# Patient Record
Sex: Male | Born: 1970 | Race: White | Hispanic: No | Marital: Married | Smoking: Former smoker
Health system: Southern US, Community
[De-identification: ages and names within clinical notes are randomized; demographics above are authoritative.]

## PROBLEM LIST (undated history)

## (undated) DIAGNOSIS — F419 Anxiety disorder, unspecified: Secondary | ICD-10-CM

---

## 2021-03-27 ENCOUNTER — Emergency Department (INDEPENDENT_AMBULATORY_CARE_PROVIDER_SITE_OTHER)
Admission: RE | Admit: 2021-03-27 | Discharge: 2021-03-27 | Disposition: A | Discharge status: Home / Self Care | Payer: 59 | Source: Ambulatory Visit | Attending: Emergency Medicine | Admitting: Emergency Medicine

## 2021-03-27 ENCOUNTER — Emergency Department (INDEPENDENT_AMBULATORY_CARE_PROVIDER_SITE_OTHER): Payer: 59

## 2021-03-27 ENCOUNTER — Other Ambulatory Visit: Payer: Self-pay

## 2021-03-27 VITALS — BP 102/73 | HR 82 | Temp 97.9°F | Resp 20 | Ht 66.0 in | Wt 167.0 lb

## 2021-03-27 DIAGNOSIS — R051 Acute cough: Secondary | ICD-10-CM | POA: Diagnosis not present

## 2021-03-27 DIAGNOSIS — J069 Acute upper respiratory infection, unspecified: Secondary | ICD-10-CM

## 2021-03-27 DIAGNOSIS — R059 Cough, unspecified: Secondary | ICD-10-CM

## 2021-03-27 HISTORY — DX: Anxiety disorder, unspecified: F41.9

## 2021-03-27 MED ORDER — ALBUTEROL SULFATE HFA 108 (90 BASE) MCG/ACT IN AERS: 1.0000 | INHALATION_SPRAY | RESPIRATORY_TRACT | 0 refills | Status: DC | PRN

## 2021-03-27 MED ORDER — PREDNISONE 20 MG PO TABS: 40.0000 mg | ORAL_TABLET | Freq: Every day | ORAL | 0 refills | Status: AC

## 2021-03-27 MED ORDER — AEROCHAMBER PLUS MISC: 2 refills | Status: DC

## 2021-03-27 NOTE — ED Provider Notes (Signed)
HPI  SUBJECTIVE:  Xavier Mcclain is a 50 y.o. male who presents with nasal congestion, greenish-yellow rhinorrhea, and lingering cough.  States that he gets easily fatigued with exertion.  He states that he was sick with flulike symptoms 2-1/2-week ago, but did not seek medical evaluation.  He has had a negative home COVID test.  He states that he overall feels good, and can exercise without any problems.  No sinus pain or pressure, postnasal drip, facial swelling, upper dental pain, wheezing, chest pain.  He is able to sleep at night without waking up coughing.  No GERD symptoms.  No antibiotics in the past 3 months.  No antipyretic in the past 6 hours.  He has tried cough drops and Robitussin.  Cough drops help.  No aggravating factors.  He got the first dose of the COVID-vaccine.  He did not get this years flu vaccine.  He has a past medical history of asthma.  PMD: He is in the process of establishing care.  He states that overall he is getting better but is concerned because the cough has not yet resolved.   Past Medical History:  Diagnosis Date   Anxiety     History reviewed. No pertinent surgical history.  Family History  Problem Relation Age of Onset   Dementia Mother    Healthy Father     Social History   Tobacco Use   Smoking status: Former    Years: 20.00    Types: Cigarettes   Smokeless tobacco: Never  Vaping Use   Vaping Use: Never used  Substance Use Topics   Alcohol use: Not Currently   Drug use: Not Currently    No current facility-administered medications for this encounter.  Current Outpatient Medications:    albuterol (VENTOLIN HFA) 108 (90 Base) MCG/ACT inhaler, Inhale 1-2 puffs into the lungs every 4 (four) hours as needed for wheezing or shortness of breath., Disp: 1 each, Rfl: 0   Menthol-Ascorbic Acid (LUDENS COUGH DROPS MT), Use as directed in the mouth or throat., Disp: , Rfl:    predniSONE (DELTASONE) 20 MG tablet, Take 2 tablets (40 mg total) by  mouth daily with breakfast for 5 days., Disp: 10 tablet, Rfl: 0   Spacer/Aero-Holding Chambers (AEROCHAMBER PLUS) inhaler, Use with inhaler, Disp: 1 each, Rfl: 2   escitalopram (LEXAPRO) 5 MG tablet, Take 5 mg by mouth daily., Disp: , Rfl:   Allergies  Allergen Reactions   Bee Venom Anaphylaxis     ROS  As noted in HPI.   Physical Exam  BP 102/73 (BP Location: Right Arm)    Pulse 82    Temp 97.9 F (36.6 C) (Oral)    Resp 20    Ht 5\' 6"  (1.676 m)    Wt 75.8 kg    SpO2 96%    BMI 26.95 kg/m   Constitutional: Well developed, well nourished, no acute distress Eyes:  EOMI, conjunctiva normal bilaterally HENT: Normocephalic, atraumatic,mucus membranes moist.  No nasal congestion.  Normal turbinates.  No maxillary, frontal sinus tenderness Respiratory: Normal inspiratory effort, wheezing bilateral bases.  Good air movement.  No chest wall tenderness Cardiovascular: Normal rate, regular rhythm, no murmurs, rubs, gallops GI: nondistended skin: No rash, skin intact Musculoskeletal: no deformities Neurologic: Alert & oriented x 3, no focal neuro deficits Psychiatric: Speech and behavior appropriate   ED Course   Medications - No data to display  Orders Placed This Encounter  Procedures   DG Chest 2 View    Standing  Status:   Standing    Number of Occurrences:   1    Order Specific Question:   Symptom/Reason for Exam    Answer:   Cough [035.2.ICD-9-CM]    No results found for this or any previous visit (from the past 24 hour(s)). DG Chest 2 View  Result Date: 03/27/2021 CLINICAL DATA:  Lingering cough since 2.5 weeks ago. EXAM: CHEST - 2 VIEW COMPARISON:  None. FINDINGS: The heart size and mediastinal contours are within normal limits. Both lungs are clear. The visualized skeletal structures are unremarkable. IMPRESSION: No active cardiopulmonary disease. Electronically Signed   By: Elige Ko M.D.   On: 03/27/2021 10:13    ED Clinical Impression  1. Viral URI with cough    2. Cough   3. Acute cough      ED Assessment/Plan  Checking chest x-ray due to focal lung findings.  He has no evidence of a sinus infection.  If positive for pneumonia, will send home with antibiotics.  If negative, will treat as bronchitis with albuterol, prednisone.  Follow-up with PMD as needed.  Reviewed imaging independently.  No pneumonia.  Normal chest x-ray.  See radiology report for full details.  X-ray negative for pneumonia.  States that overall he is getting better.  Regularly scheduled albuterol for the next 4 days, then as needed, prednisone 40 mg for 5 days.  Follow-up with PMD as needed, may return here in a week and we can consider antibiotics at that time.  Discussed  imaging, MDM, treatment plan, and plan for follow-up with patient. Discussed sn/sx that should prompt return to the ED. patient agrees with plan.   Meds ordered this encounter  Medications   albuterol (VENTOLIN HFA) 108 (90 Base) MCG/ACT inhaler    Sig: Inhale 1-2 puffs into the lungs every 4 (four) hours as needed for wheezing or shortness of breath.    Dispense:  1 each    Refill:  0   Spacer/Aero-Holding Chambers (AEROCHAMBER PLUS) inhaler    Sig: Use with inhaler    Dispense:  1 each    Refill:  2    Please educate patient on use   predniSONE (DELTASONE) 20 MG tablet    Sig: Take 2 tablets (40 mg total) by mouth daily with breakfast for 5 days.    Dispense:  10 tablet    Refill:  0      *This clinic note was created using Scientist, clinical (histocompatibility and immunogenetics). Therefore, there may be occasional mistakes despite careful proofreading.  ?    Domenick Gong, MD 03/28/21 [phone]

## 2021-03-27 NOTE — ED Triage Notes (Signed)
Pt presents to Urgent Care with c/o lingering cough since becoming sick 2.5 weeks ago. States he initially ran a fever of 102 (negative home COVID test).

## 2021-03-27 NOTE — Discharge Instructions (Addendum)
Your x-ray was negative for pneumonia today.  I am going to try treating this as a bronchitis without bronchodilators and steroids.  2 puffs from your albuterol inhaler using your spacer every 4 hours for 2 days, then every 6 hours for 2 days, then as needed.  You may back off on the albuterol sooner if you start to feel better.  Finish prednisone, even if you feel better.  If you are not better in a week, return here or see your doctor and we can consider antibiotics at that time.

## 2021-05-17 ENCOUNTER — Emergency Department (INDEPENDENT_AMBULATORY_CARE_PROVIDER_SITE_OTHER)
Admission: EM | Admit: 2021-05-17 | Discharge: 2021-05-17 | Disposition: A | Discharge status: Home / Self Care | Payer: 59 | Source: Home / Self Care

## 2021-05-17 ENCOUNTER — Other Ambulatory Visit: Payer: Self-pay

## 2021-05-17 DIAGNOSIS — J069 Acute upper respiratory infection, unspecified: Secondary | ICD-10-CM

## 2021-05-17 LAB — POCT INFLUENZA A/B
Influenza A, POC: NEGATIVE
Influenza B, POC: NEGATIVE

## 2021-05-17 LAB — POC SARS CORONAVIRUS 2 AG -  ED: SARS Coronavirus 2 Ag: NEGATIVE

## 2021-05-17 MED ORDER — BENZONATATE 100 MG PO CAPS: 100.0000 mg | ORAL_CAPSULE | Freq: Three times a day (TID) | ORAL | 0 refills | Status: DC

## 2021-05-17 NOTE — Discharge Instructions (Addendum)
Flu and COVID tests negative. Take cough medication as prescribed. Can continue with OTC medicine as well as resting and keeping hydrated. Follow-up with PCP if no improvement in a week.

## 2021-05-17 NOTE — ED Provider Notes (Signed)
[facility]    CSN: [id number] Arrival date & time: 05/17/21  0801      History   Chief Complaint Chief Complaint  Patient presents with   Sore Throat    Sore throat, cough, and chest congestion. x2days    HPI Xavier Mcclain is a 51 y.o. male.   Patient presents due to feeling unwell since yesterday. He reports nasal congestion, rhinorrhea, sore throat, productive cough, fatigue, and mild headache and body aches. He denies fever, n/v/d, or difficulty breathing. He has been using cough drops with some temporary improvement. The patient denies known sick contacts but does travel frequently for work.   The history is provided by the patient.  Sore Throat Associated symptoms include headaches. Pertinent negatives include no chest pain and no shortness of breath.   Past Medical History:  Diagnosis Date   Anxiety     There are no problems to display for this patient.   History reviewed. No pertinent surgical history.     Home Medications    Prior to Admission medications   Medication Sig Start Date End Date Taking? Authorizing Provider  albuterol (VENTOLIN HFA) 108 (90 Base) MCG/ACT inhaler Inhale 1-2 puffs into the lungs every 4 (four) hours as needed for wheezing or shortness of breath. 03/27/21  Yes Domenick Gong, MD  benzonatate (TESSALON) 100 MG capsule Take 1 capsule (100 mg total) by mouth every 8 (eight) hours. 05/17/21  Yes Matea Stanard L, PA  escitalopram (LEXAPRO) 5 MG tablet Take 5 mg by mouth daily. 01/22/21  Yes [provider]  Menthol-Ascorbic Acid (LUDENS COUGH DROPS MT) Use as directed in the mouth or throat.   Yes [provider]  Spacer/Aero-Holding Chambers (AEROCHAMBER PLUS) inhaler Use with inhaler 03/27/21  Yes Domenick Gong, MD    Family History Family History  Problem Relation Age of Onset   Dementia Mother    Healthy Father     Social History Social History   Tobacco Use   Smoking status: Former     Years: 20.00    Types: Cigarettes   Smokeless tobacco: Never  Vaping Use   Vaping Use: Never used  Substance Use Topics   Alcohol use: Yes    Comment: occ   Drug use: Not Currently     Allergies   Bee venom   Review of Systems Review of Systems  Constitutional:  Positive for fatigue. Negative for fever.  HENT:  Positive for congestion, rhinorrhea and sore throat. Negative for ear pain, sinus pressure and trouble swallowing.   Eyes:  Negative for redness.  Respiratory:  Positive for cough. Negative for shortness of breath and wheezing.   Cardiovascular:  Negative for chest pain.  Gastrointestinal:  Negative for diarrhea, nausea and vomiting.  Musculoskeletal:  Positive for myalgias.  Skin:  Negative for rash.  Neurological:  Positive for headaches. Negative for dizziness.    Physical Exam Triage Vital Signs ED Triage Vitals  Enc Vitals Group     BP 05/17/21 0814 123/82     Pulse Rate 05/17/21 0814 97     Resp 05/17/21 0814 18     Temp 05/17/21 0814 98.7 F (37.1 C)     Temp Source 05/17/21 0814 Oral     SpO2 05/17/21 0814 96 %     Weight 05/17/21 0813 167 lb (75.8 kg)     Height 05/17/21 0813 5\' 6"  (1.676 m)     Head Circumference --      Peak Flow --  Pain Score 05/17/21 0812 4     Pain Loc --      Pain Edu? --      Excl. in [place]? --    No data found.  Updated Vital Signs BP 123/82 (BP Location: Right Arm)    Pulse 97    Temp 98.7 F (37.1 C) (Oral)    Resp 18    Ht 5\' 6"  (1.676 m)    Wt 167 lb (75.8 kg)    SpO2 96%    BMI 26.95 kg/m   Visual Acuity Right Eye Distance:   Left Eye Distance:   Bilateral Distance:    Right Eye Near:   Left Eye Near:    Bilateral Near:     Physical Exam Vitals and nursing note reviewed.  Constitutional:      General: He is not in acute distress. HENT:     Head: Normocephalic.     Nose: Congestion present. No rhinorrhea.     Mouth/Throat:     Mouth: Mucous membranes are moist.     Pharynx: Oropharynx is clear. No  oropharyngeal exudate or posterior oropharyngeal erythema.  Eyes:     Conjunctiva/sclera: Conjunctivae normal.     Pupils: Pupils are equal, round, and reactive to light.  Cardiovascular:     Rate and Rhythm: Normal rate and regular rhythm.     Heart sounds: Normal heart sounds.  Pulmonary:     Effort: Pulmonary effort is normal.     Breath sounds: Normal breath sounds.  Musculoskeletal:     Cervical back: Normal range of motion.  Lymphadenopathy:     Cervical: No cervical adenopathy.  Skin:    Findings: No rash.  Neurological:     Mental Status: He is alert.     Gait: Gait normal.  Psychiatric:        Mood and Affect: Mood normal.     UC Treatments / Results  Labs (all labs ordered are listed, but only abnormal results are displayed) Labs Reviewed  POCT INFLUENZA A/B  POC SARS CORONAVIRUS 2 AG -  ED    EKG   Radiology No results found.  Procedures Procedures (including critical care time)  Medications Ordered in UC Medications - No data to display  Initial Impression / Assessment and Plan / UC Course  I have reviewed the triage vital signs and the nursing notes.  Pertinent labs & imaging results that were available during my care of the patient were reviewed by me and considered in my medical decision making (see chart for details).     COVID and flu neg. S/s consistent with viral URI. Supportive tx and reassurance.   E/M: 1 acute uncomplicated illness, 2 data (flu, covid), moderate risk due to prescription management  Final Clinical Impressions(s) / UC Diagnoses   Final diagnoses:  Viral URI with cough     Discharge Instructions      Flu and COVID tests negative. Take cough medication as prescribed. Can continue with OTC medicine as well as resting and keeping hydrated. Follow-up with PCP if no improvement in a week.      ED Prescriptions     Medication Sig Dispense Auth. Provider   benzonatate (TESSALON) 100 MG capsule Take 1 capsule (100 mg  total) by mouth every 8 (eight) hours. 21 capsule , Reuben Knoblock L, PA      PDMP not reviewed this encounter.   Vallery Sa, PA 05/17/21 0900

## 2021-05-17 NOTE — ED Triage Notes (Signed)
Pt states that he has a cough, sore throat, and chest congestion. X2 days   Pt states that he is vaccinated for covid. Pt states that he hasn't had flu vaccine.

## 2022-05-17 ENCOUNTER — Other Ambulatory Visit: Payer: Self-pay

## 2022-05-17 ENCOUNTER — Ambulatory Visit
Admission: RE | Admit: 2022-05-17 | Discharge: 2022-05-17 | Disposition: A | Discharge status: Home / Self Care | Payer: 59 | Source: Ambulatory Visit | Attending: Family Medicine | Admitting: Family Medicine

## 2022-05-17 VITALS — BP 134/91 | HR 111 | Temp 98.4°F | Resp 17

## 2022-05-17 DIAGNOSIS — J029 Acute pharyngitis, unspecified: Secondary | ICD-10-CM | POA: Diagnosis not present

## 2022-05-17 LAB — POCT INFLUENZA A/B
Influenza A, POC: NEGATIVE
Influenza B, POC: NEGATIVE

## 2022-05-17 LAB — POC SARS CORONAVIRUS 2 AG -  ED: SARS Coronavirus 2 Ag: NEGATIVE

## 2022-05-17 NOTE — ED Provider Notes (Signed)
[facility]    CSN: [id number] Arrival date & time: 05/17/22  1145      History   Chief Complaint Chief Complaint  Patient presents with   Sore Throat    Need a covid test. - Entered by patient    HPI Xavier Mcclain is a 52 y.o. male.   HPI  This is a healthy 52 year old male.  Treated for anxiety with Lexapro.  Is here because he has symptoms including sore throat and fatigue to start this morning.  His wife tested positive for COVID 6 days ago.  He is requesting COVID testing  Past Medical History:  Diagnosis Date   Anxiety     There are no problems to display for this patient.   History reviewed. No pertinent surgical history.     Home Medications    Prior to Admission medications   Medication Sig Start Date End Date Taking? Authorizing Provider  escitalopram (LEXAPRO) 5 MG tablet Take 5 mg by mouth daily. 01/22/21   [provider]    Family History Family History  Problem Relation Age of Onset   Dementia Mother    Healthy Father     Social History Social History   Tobacco Use   Smoking status: Former    Years: 20.00    Types: Cigarettes   Smokeless tobacco: Never  Vaping Use   Vaping Use: Never used  Substance Use Topics   Alcohol use: Yes   Drug use: Not Currently     Allergies   Bee venom and Nickel   Review of Systems Review of Systems   Physical Exam Triage Vital Signs ED Triage Vitals  Enc Vitals Group     BP 05/17/22 1205 (!) 134/91     Pulse Rate 05/17/22 1205 (!) 111     Resp 05/17/22 1205 17     Temp 05/17/22 1205 98.4 F (36.9 C)     Temp Source 05/17/22 1205 Oral     SpO2 05/17/22 1205 94 %     Weight --      Height --      Head Circumference --      Peak Flow --      Pain Score 05/17/22 1203 0     Pain Loc --      Pain Edu? --      Excl. in [place]? --    No data found.  Updated Vital Signs BP (!) 134/91 (BP Location: Left Arm)   Pulse (!) 111   Temp 98.4 F (36.9 C) (Oral)   Resp 17    SpO2 94%      Physical Exam Constitutional:      General: He is not in acute distress.    Appearance: He is well-developed. He is ill-appearing.  HENT:     Head: Normocephalic and atraumatic.     Right Ear: Tympanic membrane and ear canal normal.     Left Ear: Tympanic membrane and ear canal normal.     Nose: Nose normal. No congestion.     Mouth/Throat:     Mouth: Mucous membranes are moist.     Pharynx: Posterior oropharyngeal erythema present.  Eyes:     Conjunctiva/sclera: Conjunctivae normal.     Pupils: Pupils are equal, round, and reactive to light.  Cardiovascular:     Rate and Rhythm: Normal rate and regular rhythm.     Heart sounds: Normal heart sounds.  Pulmonary:     Effort: Pulmonary effort is normal. No  respiratory distress.     Breath sounds: Normal breath sounds.  Abdominal:     General: There is no distension.     Palpations: Abdomen is soft.  Musculoskeletal:        General: Normal range of motion.     Cervical back: Normal range of motion.  Lymphadenopathy:     Cervical: No cervical adenopathy.  Skin:    General: Skin is warm and dry.  Neurological:     Mental Status: He is alert.      UC Treatments / Results  Labs (all labs ordered are listed, but only abnormal results are displayed) Labs Reviewed  SARS CORONAVIRUS 2 (TAT 6-24 HRS)  POC SARS CORONAVIRUS 2 AG -  ED  POCT INFLUENZA A/B    EKG   Radiology No results found.  Procedures Procedures (including critical care time)  Medications Ordered in UC Medications - No data to display  Initial Impression / Assessment and Plan / UC Course  I have reviewed the triage vital signs and the nursing notes.  Pertinent labs & imaging results that were available during my care of the patient were reviewed by me and considered in my medical decision making (see chart for details).     Patient states he has an important business trip and is going to fly on Monday.  Because of this I am  sending a PCR COVID test in addition to the negative POC test, as it is more accurate.  He did have direct exposure.  Symptomatic care reviewed Final Clinical Impressions(s) / UC Diagnoses   Final diagnoses:  Viral pharyngitis     Discharge Instructions      We will send a COVID test to the laboratory for further analysis So far the rapid strep and rapid COVID are both negative May drink lots of fluids and take ibuprofen or Tylenol for pain and fever Check MyChart for your test results     ED Prescriptions   None    PDMP not reviewed this encounter.   Raylene Everts, MD 05/17/22 1314

## 2022-05-17 NOTE — ED Triage Notes (Addendum)
Pt is here with a sore throat that started this morning, pt states his wife tested POSITIVE for COVID on Sunday. Pt has taken lozenges and rum to relieve discomfort.

## 2022-05-17 NOTE — Discharge Instructions (Signed)
We will send a COVID test to the laboratory for further analysis So far the rapid strep and rapid COVID are both negative May drink lots of fluids and take ibuprofen or Tylenol for pain and fever Check MyChart for your test results

## 2022-05-18 ENCOUNTER — Telehealth: Payer: Self-pay | Admitting: Emergency Medicine

## 2022-05-18 LAB — SARS CORONAVIRUS 2 (TAT 6-24 HRS): SARS Coronavirus 2: NEGATIVE

## 2022-05-18 NOTE — Telephone Encounter (Signed)
[place].  Advised if doing well to disregard the call.  Any questions or concerns, don't hesitate to contact the office.

## 2023-04-15 IMAGING — DX DG CHEST 2V
2 series · 2 of 2 positions shown · non-contrast
Comparison: None.

CLINICAL DATA: Lingering cough since 2.5 weeks ago.

EXAM:
CHEST - 2 VIEW

[chest pa]
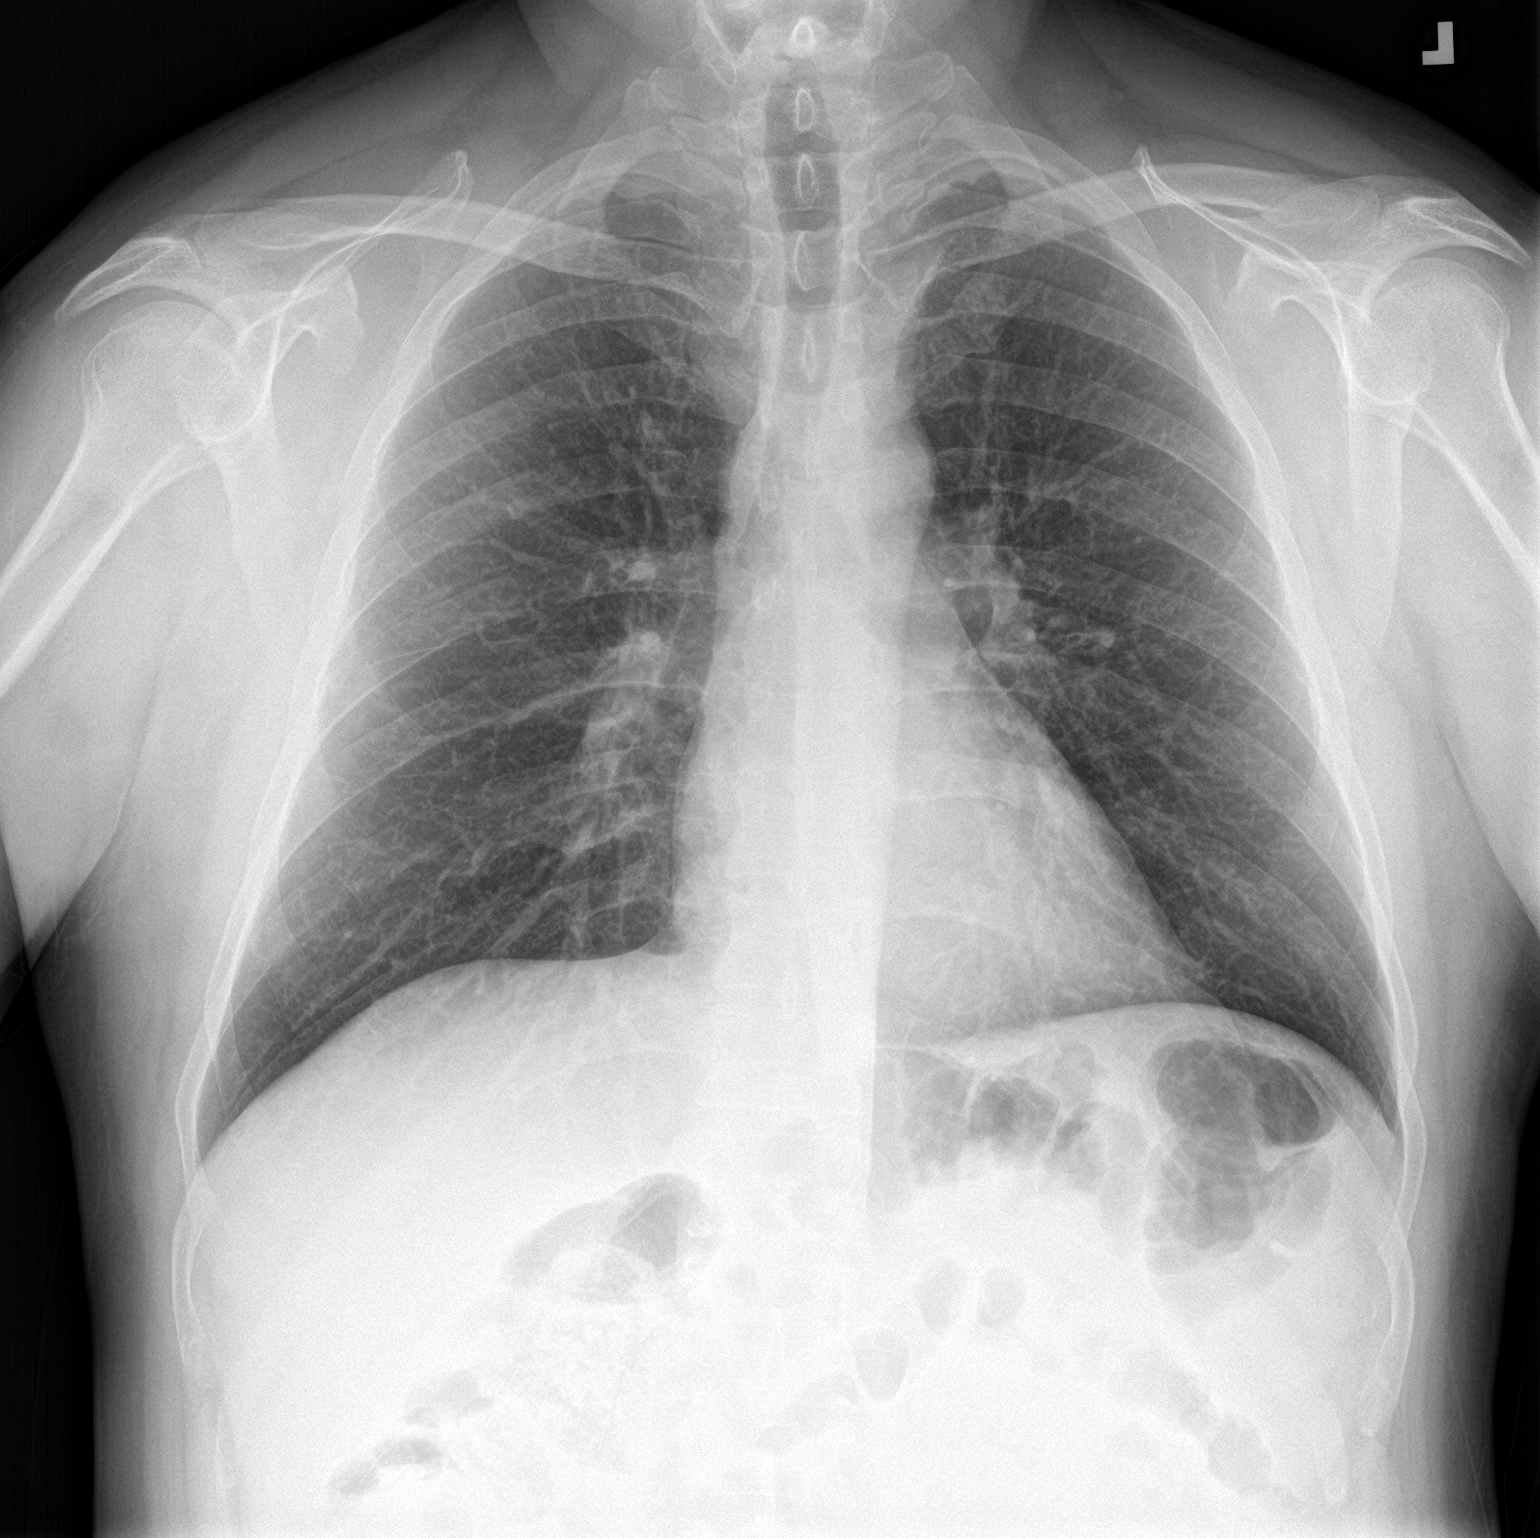

[chest lat]
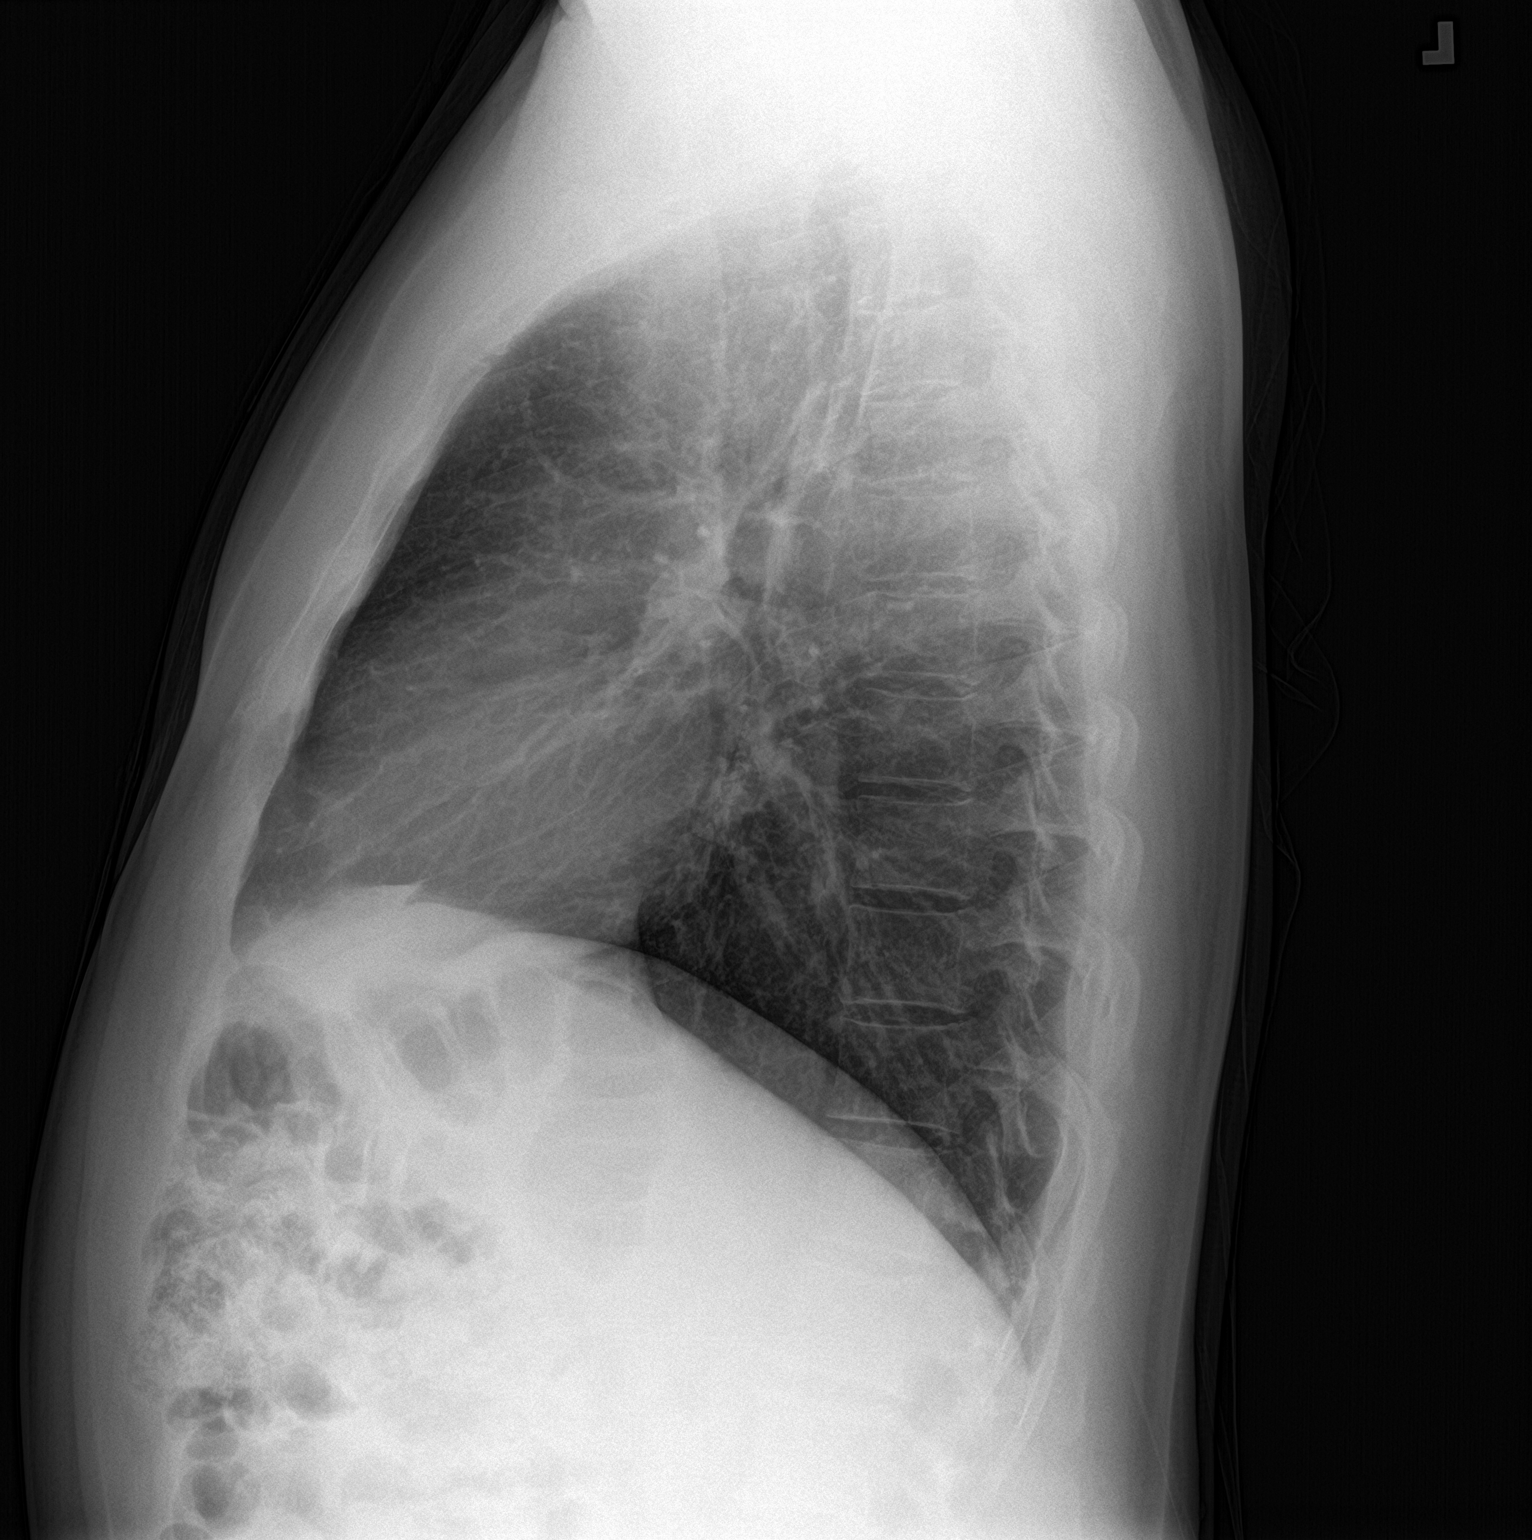

[2 of 2 positions shown; findings below may reference images not displayed]

FINDINGS: The heart size and mediastinal contours are within normal limits.
Both lungs are clear. The visualized skeletal structures are
unremarkable.
IMPRESSION: No active cardiopulmonary disease.
